# Patient Record
Sex: Female | Born: 1970 | Race: Black or African American | Hispanic: No | Smoking: Never smoker
Health system: Southern US, Community
[De-identification: ages and names within clinical notes are randomized; demographics above are authoritative.]

## PROBLEM LIST (undated history)

## (undated) DIAGNOSIS — I10 Essential (primary) hypertension: Secondary | ICD-10-CM

## (undated) HISTORY — PX: TUBAL LIGATION: SHX77

---

## 1999-11-26 ENCOUNTER — Emergency Department (HOSPITAL_COMMUNITY): Admission: EM | Admit: 1999-11-26 | Discharge: 1999-11-26 | Payer: Self-pay | Admitting: Emergency Medicine

## 2002-04-02 ENCOUNTER — Encounter: Payer: Self-pay | Admitting: Emergency Medicine

## 2002-04-02 ENCOUNTER — Emergency Department (HOSPITAL_COMMUNITY): Admission: EM | Admit: 2002-04-02 | Discharge: 2002-04-02 | Payer: Self-pay | Admitting: Emergency Medicine

## 2004-05-02 ENCOUNTER — Emergency Department (HOSPITAL_COMMUNITY): Admission: EM | Admit: 2004-05-02 | Discharge: 2004-05-02 | Payer: Self-pay | Admitting: Emergency Medicine

## 2005-08-23 ENCOUNTER — Inpatient Hospital Stay (HOSPITAL_COMMUNITY): Admission: AD | Admit: 2005-08-23 | Discharge: 2005-08-23 | Payer: Self-pay | Admitting: Obstetrics and Gynecology

## 2006-11-20 ENCOUNTER — Emergency Department (HOSPITAL_COMMUNITY): Admission: EM | Admit: 2006-11-20 | Discharge: 2006-11-20 | Payer: Self-pay | Admitting: Emergency Medicine

## 2010-10-09 ENCOUNTER — Encounter: Payer: Self-pay | Admitting: Emergency Medicine

## 2010-10-09 ENCOUNTER — Emergency Department (HOSPITAL_BASED_OUTPATIENT_CLINIC_OR_DEPARTMENT_OTHER)
Admission: EM | Admit: 2010-10-09 | Discharge: 2010-10-09 | Disposition: A | Discharge status: Home / Self Care | Payer: Self-pay | Attending: Emergency Medicine | Admitting: Emergency Medicine

## 2010-10-09 DIAGNOSIS — J309 Allergic rhinitis, unspecified: Secondary | ICD-10-CM | POA: Insufficient documentation

## 2010-10-09 DIAGNOSIS — I10 Essential (primary) hypertension: Secondary | ICD-10-CM | POA: Insufficient documentation

## 2010-10-09 HISTORY — DX: Essential (primary) hypertension: I10

## 2010-10-09 NOTE — ED Notes (Signed)
Pt c/o sinus congestion with difficulty breathing. Not relieve with OTC meds.

## 2010-10-09 NOTE — ED Notes (Signed)
Pt presents to ED today with c/o sinus congestion for the past week.  Pt has non-productive cough with no fever noted.  Pt has tried OTC meds with no relief in sx.

## 2010-10-10 NOTE — ED Provider Notes (Signed)
History     CSN: [id number] Arrival date & time: 10/09/2010  8:10 PM  Chief Complaint  Patient presents with  . Nasal Congestion   The history is provided by the patient.  She has had nasal congestion for the past week. Symptoms have been severe. She has taken Benadryl with slight relief. She has not had any fever. Rhinorrhea has been clear. No sinus pain. No cough. No dyspnea. She says she tried a nasal spray with no relief.  Past Medical History  Diagnosis Date  . Hypertension     Past Surgical History  Procedure Date  . Tubal ligation     No family history on file.  History  Substance Use Topics  . Smoking status: Never Smoker   . Smokeless tobacco: Not on file  . Alcohol Use: No    OB History    Grav Para Term Preterm Abortions TAB SAB Ect Mult Living                  Review of Systems  All other systems reviewed and are negative.    Physical Exam  BP 129/94  Pulse 96  Temp(Src) 97.9 F (36.6 C) (Oral)  Resp 20  SpO2 100%  Physical Exam  Nursing note and vitals reviewed. Constitutional: She is oriented to person, place, and time. She appears well-developed and well-nourished. No distress.  HENT:  Head: Normocephalic and atraumatic.  Right Ear: External ear normal.  Left Ear: External ear normal.  Mouth/Throat: Oropharynx is clear and moist.       No sinus tenderness.  Eyes: Conjunctivae and EOM are normal. Pupils are equal, round, and reactive to light. No scleral icterus.  Neck: Normal range of motion. Neck supple. No JVD present.  Cardiovascular: Normal rate, regular rhythm and normal heart sounds.   No murmur heard. Pulmonary/Chest: Effort normal and breath sounds normal. She has no wheezes. She has no rales. She exhibits no tenderness.  Abdominal: Soft. Bowel sounds are normal. She exhibits no mass. There is no tenderness.  Musculoskeletal: Normal range of motion. She exhibits no edema and no tenderness.  Lymphadenopathy:    She has no cervical  adenopathy.  Neurological: She is alert and oriented to person, place, and time. No cranial nerve deficit. Coordination normal.  Skin: Skin is warm and dry. No rash noted.  Psychiatric: She has a normal mood and affect. Her behavior is normal. Judgment and thought content normal.    ED Course  Procedures  MDM Rhinitis - probably allergic, possibly viral. Unlikely to benefit from steroid nasal spray since it would take several weeks to be effective.      Dione Booze, MD 10/10/10 [phone]

## 2012-11-17 ENCOUNTER — Other Ambulatory Visit: Payer: Self-pay

## 2012-11-17 DIAGNOSIS — Z1231 Encounter for screening mammogram for malignant neoplasm of breast: Secondary | ICD-10-CM

## 2012-11-24 ENCOUNTER — Ambulatory Visit
Admission: RE | Admit: 2012-11-24 | Discharge: 2012-11-24 | Disposition: A | Discharge status: Home / Self Care | Payer: BC Managed Care – PPO | Source: Ambulatory Visit

## 2012-11-24 DIAGNOSIS — Z1231 Encounter for screening mammogram for malignant neoplasm of breast: Secondary | ICD-10-CM

## 2012-12-08 ENCOUNTER — Other Ambulatory Visit: Payer: Self-pay | Admitting: Family Medicine

## 2012-12-08 DIAGNOSIS — N644 Mastodynia: Secondary | ICD-10-CM

## 2014-06-25 ENCOUNTER — Emergency Department (HOSPITAL_BASED_OUTPATIENT_CLINIC_OR_DEPARTMENT_OTHER): Payer: 59

## 2014-06-25 ENCOUNTER — Emergency Department (HOSPITAL_BASED_OUTPATIENT_CLINIC_OR_DEPARTMENT_OTHER)
Admission: EM | Admit: 2014-06-25 | Discharge: 2014-06-25 | Disposition: A | Discharge status: Home / Self Care | Payer: 59 | Attending: Emergency Medicine | Admitting: Emergency Medicine

## 2014-06-25 ENCOUNTER — Encounter (HOSPITAL_BASED_OUTPATIENT_CLINIC_OR_DEPARTMENT_OTHER): Payer: Self-pay | Admitting: *Deleted

## 2014-06-25 DIAGNOSIS — Y998 Other external cause status: Secondary | ICD-10-CM | POA: Diagnosis not present

## 2014-06-25 DIAGNOSIS — Z79899 Other long term (current) drug therapy: Secondary | ICD-10-CM | POA: Diagnosis not present

## 2014-06-25 DIAGNOSIS — I10 Essential (primary) hypertension: Secondary | ICD-10-CM | POA: Insufficient documentation

## 2014-06-25 DIAGNOSIS — M62838 Other muscle spasm: Secondary | ICD-10-CM

## 2014-06-25 DIAGNOSIS — S161XXA Strain of muscle, fascia and tendon at neck level, initial encounter: Secondary | ICD-10-CM | POA: Insufficient documentation

## 2014-06-25 DIAGNOSIS — S3992XA Unspecified injury of lower back, initial encounter: Secondary | ICD-10-CM | POA: Diagnosis not present

## 2014-06-25 DIAGNOSIS — Y92410 Unspecified street and highway as the place of occurrence of the external cause: Secondary | ICD-10-CM | POA: Diagnosis not present

## 2014-06-25 DIAGNOSIS — Y9389 Activity, other specified: Secondary | ICD-10-CM | POA: Insufficient documentation

## 2014-06-25 DIAGNOSIS — S199XXA Unspecified injury of neck, initial encounter: Secondary | ICD-10-CM | POA: Diagnosis present

## 2014-06-25 DIAGNOSIS — S4991XA Unspecified injury of right shoulder and upper arm, initial encounter: Secondary | ICD-10-CM | POA: Diagnosis not present

## 2014-06-25 MED ORDER — NAPROXEN 500 MG PO TABS: 500.0000 mg | ORAL_TABLET | Freq: Two times a day (BID) | ORAL | Status: AC

## 2014-06-25 MED ORDER — CYCLOBENZAPRINE HCL 10 MG PO TABS: 10.0000 mg | ORAL_TABLET | Freq: Three times a day (TID) | ORAL | Status: AC | PRN
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
## 2014-06-25 NOTE — ED Notes (Signed)
Patient transported to X-ray 

## 2014-06-25 NOTE — Discharge Instructions (Signed)
Take naproxen as prescribed. Rest, apply ice intermittently for the next 24 hours followed by heat. Avoid heavy lifting or hard physical activity. No driving or operating heavy machinery while taking flexeril. This medication may make you drowsy.  Cervical Sprain A cervical sprain is an injury in the neck in which the strong, fibrous tissues (ligaments) that connect your neck bones stretch or tear. Cervical sprains can range from mild to severe. Severe cervical sprains can cause the neck vertebrae to be unstable. This can lead to damage of the spinal cord and can result in serious nervous system problems. The amount of time it takes for a cervical sprain to get better depends on the cause and extent of the injury. Most cervical sprains heal in 1 to 3 weeks. CAUSES  Severe cervical sprains may be caused by:   Contact sport injuries (such as from football, rugby, wrestling, hockey, auto racing, gymnastics, diving, martial arts, or boxing).   Motor vehicle collisions.   Whiplash injuries. This is an injury from a sudden forward and backward whipping movement of the head and neck.  Falls.  Mild cervical sprains may be caused by:   Being in an awkward position, such as while cradling a telephone between your ear and shoulder.   Sitting in a chair that does not offer proper support.   Working at a poorly Marketing executivedesigned computer station.   Looking up or down for long periods of time.  SYMPTOMS   Pain, soreness, stiffness, or a burning sensation in the front, back, or sides of the neck. This discomfort may develop immediately after the injury or slowly, 24 hours or more after the injury.   Pain or tenderness directly in the middle of the back of the neck.   Shoulder or upper back pain.   Limited ability to move the neck.   Headache.   Dizziness.   Weakness, numbness, or tingling in the hands or arms.   Muscle spasms.   Difficulty swallowing or chewing.   Tenderness and  swelling of the neck.  DIAGNOSIS  Most of the time your health care provider can diagnose a cervical sprain by taking your history and doing a physical exam. Your health care provider will ask about previous neck injuries and any known neck problems, such as arthritis in the neck. X-rays may be taken to find out if there are any other problems, such as with the bones of the neck. Other tests, such as a CT scan or MRI, may also be needed.  TREATMENT  Treatment depends on the severity of the cervical sprain. Mild sprains can be treated with rest, keeping the neck in place (immobilization), and pain medicines. Severe cervical sprains are immediately immobilized. Further treatment is done to help with pain, muscle spasms, and other symptoms and may include:  Medicines, such as pain relievers, numbing medicines, or muscle relaxants.   Physical therapy. This may involve stretching exercises, strengthening exercises, and posture training. Exercises and improved posture can help stabilize the neck, strengthen muscles, and help stop symptoms from returning.  HOME CARE INSTRUCTIONS   Put ice on the injured area.   Put ice in a plastic bag.   Place a towel between your skin and the bag.   Leave the ice on for 15-20 minutes, 3-4 times a day.   If your injury was severe, you may have been given a cervical collar to wear. A cervical collar is a two-piece collar designed to keep your neck from moving while it heals.  Do not remove the collar unless instructed by your health care provider.  If you have long hair, keep it outside of the collar.  Ask your health care provider before making any adjustments to your collar. Minor adjustments may be required over time to improve comfort and reduce pressure on your chin or on the back of your head.  Ifyou are allowed to remove the collar for cleaning or bathing, follow your health care provider's instructions on how to do so safely.  Keep your collar  clean by wiping it with mild soap and water and drying it completely. If the collar you have been given includes removable pads, remove them every 1-2 days and hand wash them with soap and water. Allow them to air dry. They should be completely dry before you wear them in the collar.  If you are allowed to remove the collar for cleaning and bathing, wash and dry the skin of your neck. Check your skin for irritation or sores. If you see any, tell your health care provider.  Do not drive while wearing the collar.   Only take over-the-counter or prescription medicines for pain, discomfort, or fever as directed by your health care provider.   Keep all follow-up appointments as directed by your health care provider.   Keep all physical therapy appointments as directed by your health care provider.   Make any needed adjustments to your workstation to promote good posture.   Avoid positions and activities that make your symptoms worse.   Warm up and stretch before being active to help prevent problems.  SEEK MEDICAL CARE IF:   Your pain is not controlled with medicine.   You are unable to decrease your pain medicine over time as planned.   Your activity level is not improving as expected.  SEEK IMMEDIATE MEDICAL CARE IF:   You develop any bleeding.  You develop stomach upset.  You have signs of an allergic reaction to your medicine.   Your symptoms get worse.   You develop new, unexplained symptoms.   You have numbness, tingling, weakness, or paralysis in any part of your body.  MAKE SURE YOU:   Understand these instructions.  Will watch your condition.  Will get help right away if you are not doing well or get worse. Document Released: 11/15/2006 Document Revised: 01/23/2013 Document Reviewed: 07/26/2012 [facility] Patient Information 2015 [place], [place]. This information is not intended to replace advice given to you by your health care provider. Make sure you  discuss any questions you have with your health care provider.  Motor Vehicle Collision It is common to have multiple bruises and sore muscles after a motor vehicle collision (MVC). These tend to feel worse for the first 24 hours. You may have the most stiffness and soreness over the first several hours. You may also feel worse when you wake up the first morning after your collision. After this point, you will usually begin to improve with each day. The speed of improvement often depends on the severity of the collision, the number of injuries, and the location and nature of these injuries. HOME CARE INSTRUCTIONS  Put ice on the injured area.  Put ice in a plastic bag.  Place a towel between your skin and the bag.  Leave the ice on for 15-20 minutes, 3-4 times a day, or as directed by your health care provider.  Drink enough fluids to keep your urine clear or pale yellow. Do not drink alcohol.  Take a warm  shower or bath once or twice a day. This will increase blood flow to sore muscles.  You may return to activities as directed by your caregiver. Be careful when lifting, as this may aggravate neck or back pain.  Only take over-the-counter or prescription medicines for pain, discomfort, or fever as directed by your caregiver. Do not use aspirin. This may increase bruising and bleeding. SEEK IMMEDIATE MEDICAL CARE IF:  You have numbness, tingling, or weakness in the arms or legs.  You develop severe headaches not relieved with medicine.  You have severe neck pain, especially tenderness in the middle of the back of your neck.  You have changes in bowel or bladder control.  There is increasing pain in any area of the body.  You have shortness of breath, light-headedness, dizziness, or fainting.  You have chest pain.  You feel sick to your stomach (nauseous), throw up (vomit), or sweat.  You have increasing abdominal discomfort.  There is blood in your urine, stool, or  vomit.  You have pain in your shoulder (shoulder strap areas).  You feel your symptoms are getting worse. MAKE SURE YOU:  Understand these instructions.  Will watch your condition.  Will get help right away if you are not doing well or get worse. Document Released: 01/18/2005 Document Revised: 06/04/2013 Document Reviewed: 06/17/2010 [facility] Patient Information 2015 [place], [place]. This information is not intended to replace advice given to you by your health care provider. Make sure you discuss any questions you have with your health care provider.  Muscle Strain A muscle strain is an injury that occurs when a muscle is stretched beyond its normal length. Usually a small number of muscle fibers are torn when this happens. Muscle strain is rated in degrees. First-degree strains have the least amount of muscle fiber tearing and pain. Second-degree and third-degree strains have increasingly more tearing and pain.  Usually, recovery from muscle strain takes 1-2 weeks. Complete healing takes 5-6 weeks.  CAUSES  Muscle strain happens when a sudden, violent force placed on a muscle stretches it too far. This may occur with lifting, sports, or a fall.  RISK FACTORS Muscle strain is especially common in athletes.  SIGNS AND SYMPTOMS At the site of the muscle strain, there may be:  Pain.  Bruising.  Swelling.  Difficulty using the muscle due to pain or lack of normal function. DIAGNOSIS  Your health care provider will perform a physical exam and ask about your medical history. TREATMENT  Often, the best treatment for a muscle strain is resting, icing, and applying cold compresses to the injured area.  HOME CARE INSTRUCTIONS   Use the PRICE method of treatment to promote muscle healing during the first 2-3 days after your injury. The PRICE method involves:  Protecting the muscle from being injured again.  Restricting your activity and resting the injured body part.  Icing your  injury. To do this, put ice in a plastic bag. Place a towel between your skin and the bag. Then, apply the ice and leave it on from 15-20 minutes each hour. After the third day, switch to moist heat packs.  Apply compression to the injured area with a splint or elastic bandage. Be careful not to wrap it too tightly. This may interfere with blood circulation or increase swelling.  Elevate the injured body part above the level of your heart as often as you can.  Only take over-the-counter or prescription medicines for pain, discomfort, or fever as directed by your  health care provider.  Warming up prior to exercise helps to prevent future muscle strains. SEEK MEDICAL CARE IF:   You have increasing pain or swelling in the injured area.  You have numbness, tingling, or a significant loss of strength in the injured area. MAKE SURE YOU:   Understand these instructions.  Will watch your condition.  Will get help right away if you are not doing well or get worse. Document Released: 01/18/2005 Document Revised: 11/08/2012 Document Reviewed: 08/17/2012 [facility] Patient Information 2015 [place], [place]. This information is not intended to replace advice given to you by your health care provider. Make sure you discuss any questions you have with your health care provider.
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
## 2014-07-19 ENCOUNTER — Encounter (HOSPITAL_BASED_OUTPATIENT_CLINIC_OR_DEPARTMENT_OTHER): Payer: Self-pay | Admitting: *Deleted

## 2014-07-19 ENCOUNTER — Emergency Department (HOSPITAL_BASED_OUTPATIENT_CLINIC_OR_DEPARTMENT_OTHER)
Admission: EM | Admit: 2014-07-19 | Discharge: 2014-07-19 | Disposition: A | Discharge status: Home / Self Care | Payer: 59 | Attending: Emergency Medicine | Admitting: Emergency Medicine

## 2014-07-19 ENCOUNTER — Emergency Department (HOSPITAL_BASED_OUTPATIENT_CLINIC_OR_DEPARTMENT_OTHER): Payer: 59

## 2014-07-19 DIAGNOSIS — M79662 Pain in left lower leg: Secondary | ICD-10-CM | POA: Diagnosis present

## 2014-07-19 DIAGNOSIS — M79606 Pain in leg, unspecified: Secondary | ICD-10-CM

## 2014-07-19 DIAGNOSIS — Z79899 Other long term (current) drug therapy: Secondary | ICD-10-CM | POA: Insufficient documentation

## 2014-07-19 DIAGNOSIS — M25471 Effusion, right ankle: Secondary | ICD-10-CM | POA: Diagnosis not present

## 2014-07-19 DIAGNOSIS — M25472 Effusion, left ankle: Secondary | ICD-10-CM | POA: Insufficient documentation

## 2014-07-19 DIAGNOSIS — Z791 Long term (current) use of non-steroidal anti-inflammatories (NSAID): Secondary | ICD-10-CM | POA: Insufficient documentation

## 2014-07-19 DIAGNOSIS — I10 Essential (primary) hypertension: Secondary | ICD-10-CM | POA: Diagnosis not present

## 2014-07-19 NOTE — ED Notes (Signed)
Patient transported to Ultrasound 

## 2014-07-19 NOTE — ED Provider Notes (Addendum)
CSN: [id number]     Arrival date & time 07/19/14  1009 History   First MD Initiated Contact with Patient 07/19/14 1020     Chief Complaint  Patient presents with  . Leg Pain     (Consider location/radiation/quality/duration/timing/severity/associated sxs/prior Treatment) Patient is a 44 y.o. female presenting with leg pain. The history is provided by the patient.  Leg Pain Location:  Leg Time since incident:  2 weeks Injury: no   Leg location:  L lower leg Pain details:    Quality:  Aching and throbbing   Radiates to:  Does not radiate   Severity:  Moderate   Onset quality:  Gradual   Timing:  Constant   Progression:  Improving Chronicity:  New Prior injury to area:  No Relieved by:  Rest and elevation Worsened by:  Bearing weight and activity Associated symptoms: swelling   Associated symptoms: no decreased ROM, no fever and no numbness   Risk factors comment:  Mom with hx of blood clots   Past Medical History  Diagnosis Date  . Hypertension    Past Surgical History  Procedure Laterality Date  . Tubal ligation     No family history on file. History  Substance Use Topics  . Smoking status: Never Smoker   . Smokeless tobacco: Not on file  . Alcohol Use: No   OB History    No data available     Review of Systems  Constitutional: Negative for fever.  All other systems reviewed and are negative.     Allergies  Fish allergy and Sulfa antibiotics  Home Medications   Prior to Admission medications   Medication Sig Start Date End Date Taking? Authorizing Provider  AMLODIPINE BESYLATE PO Take by mouth.    Historical Provider, MD  cyclobenzaprine (FLEXERIL) 10 MG tablet Take 1 tablet (10 mg total) by mouth every 8 (eight) hours as needed for muscle spasms. 06/25/14   Kathrynn Speed, PA-C  diphenhydrAMINE (BENADRYL) 25 MG tablet Take 25 mg by mouth every 6 (six) hours as needed. Allergies      Historical Provider, MD  Garlic 100 MG TABS Take 1 tablet by mouth  daily.      Historical Provider, MD  Homeopathic Products [facility] COLD REMEDY PO) Take 1 each by mouth every 3 (three) hours as needed. Cough     Historical Provider, MD  HYDROCHLOROTHIAZIDE PO Take by mouth.    Historical Provider, MD  lisinopril-hydrochlorothiazide (PRINZIDE,ZESTORETIC) 20-25 MG per tablet Take 1 tablet by mouth daily.      Historical Provider, MD  loratadine (CLARITIN) 10 MG tablet Take 10 mg by mouth daily as needed. allergies     Historical Provider, MD  naproxen (NAPROSYN) 500 MG tablet Take 1 tablet (500 mg total) by mouth 2 (two) times daily. 06/25/14   Kathrynn Speed, PA-C  oxymetazoline (AFRIN) 0.05 % nasal spray Place 2 sprays into the nose 2 (two) times daily.      Historical Provider, MD  phenylephrine (SUDAFED PE) 10 MG TABS Take 10 mg by mouth every 4 (four) hours as needed. allergies     Historical Provider, MD   BP 131/72 mmHg  Pulse 88  Temp(Src) 98.4 F (36.9 C) (Oral)  Resp 18  Ht 5\' 4"  (1.626 m)  Wt 260 lb (117.935 kg)  BMI 44.61 kg/m2  SpO2 100%  LMP 06/21/2014 Physical Exam  Constitutional: She is oriented to person, place, and time. She appears well-developed and well-nourished. No distress.  HENT:  Head: Normocephalic and atraumatic.  Mouth/Throat: Oropharynx is clear and moist.  Eyes: Conjunctivae and EOM are normal. Pupils are equal, round, and reactive to light.  Neck: Normal range of motion. Neck supple.  Cardiovascular: Normal rate, regular rhythm, normal heart sounds and intact distal pulses.   No murmur heard. Pulmonary/Chest: Effort normal and breath sounds normal. No respiratory distress. She has no wheezes. She has no rales.  Musculoskeletal: Normal range of motion. She exhibits no edema or tenderness.  Mild tenderness over the lateral portion of the left lower leg.  No notable swelling.  2+ DP pulses  Neurological: She is alert and oriented to person, place, and time.  Skin: Skin is warm and dry. No rash noted. No erythema.   Psychiatric: She has a normal mood and affect. Her behavior is normal.  Nursing note and vitals reviewed.   ED Course  Procedures (including critical care time) Labs Review Labs Reviewed - No data to display  Imaging Review US Venous Img Lower Unilateral Left  07/19/2014   CLINICAL DATA:  Left lateral calf pain for 2 weeks. No known injury.  EXAM: Left LOWER EXTREMITY VENOUS DOPPLER ULTRASOUND  TECHNIQUE: Gray-scale sonography with graded compression, as well as color Doppler and duplex ultrasound were performed to evaluate the lower extremity deep venous systems from the level of the common femoral vein and including the common femoral, femoral, profunda femoral, popliteal and calf veins including the posterior tibial, peroneal and gastrocnemius veins when visible. The superficial great saphenous vein was also interrogated. Spectral Doppler was utilized to evaluate flow at rest and with distal augmentation maneuvers in the common femoral, femoral and popliteal veins.  COMPARISON:  None.  FINDINGS: Contralateral Common Femoral Vein: Respiratory phasicity is normal and symmetric with the symptomatic side. No evidence of thrombus. Normal compressibility.  Common Femoral Vein: No evidence of thrombus. Normal compressibility, respiratory phasicity and response to augmentation.  Saphenofemoral Junction: No evidence of thrombus. Normal compressibility and flow on color Doppler imaging.  Profunda Femoral Vein: No evidence of thrombus. Normal compressibility and flow on color Doppler imaging.  Femoral Vein: No evidence of thrombus. Normal compressibility, respiratory phasicity and response to augmentation.  Popliteal Vein: No evidence of thrombus. Normal compressibility, respiratory phasicity and response to augmentation.  Calf Veins: No evidence of thrombus. Normal compressibility and flow on color Doppler imaging. Limited assessment of calf veins due to body habitus. Peroneal vein not well seen.  Superficial  Great Saphenous Vein: No evidence of thrombus. Normal compressibility and flow on color Doppler imaging.  Venous Reflux:  None.  Other Findings:  None.  IMPRESSION: No evidence of deep venous thrombosis.   Electronically Signed   By: Gaylyn Rong M.D.   On: 07/19/2014 12:00     EKG Interpretation None      MDM   Final diagnoses:  Leg pain    Patient with a two-week history of left lateral leg tenderness that is worse when attempting to ambulate and mild ankle swelling bilaterally. Patient works in a job where she is on her feet constantly and pain is statically worst with walking. She does not wear support hose and denies a history of prior clot. No recent travel, hormone therapy however her mother does have history  Denies CP or SOB.    Doppler of the left lower ext pending.  12:11 PM Neg for clot.  Pt d/ced home.  Gwyneth Sprout, MD 07/19/14 [id number]  Gwyneth Sprout, MD 07/19/14 1212

## 2014-07-19 NOTE — ED Notes (Signed)
Pt c/o left heel pain and ankle swelling "for a while" but now the pain is spreading up her left calf. Pt c/o some SOB and feeling jittery. Denies CP or any long car trips recently.

## 2014-07-19 NOTE — ED Notes (Signed)
Pt d/c home. Referral given and instructions for f/u discussed

## 2014-07-19 NOTE — ED Notes (Signed)
MD at bedside. 

## 2014-07-19 NOTE — ED Notes (Signed)
Pt given snack per okay from Dr Anitra Lauth

## 2016-11-17 IMAGING — US US EXTREM LOW VENOUS*L*
1 series · 13 of 24 positions shown · non-contrast
Comparison: None.

CLINICAL DATA: Left lateral calf pain for 2 weeks. No known injury.



[Series 1: us extrem low venous*left* · 0.08mm/px · 13 of 40 slices shown]
[im 1/40]
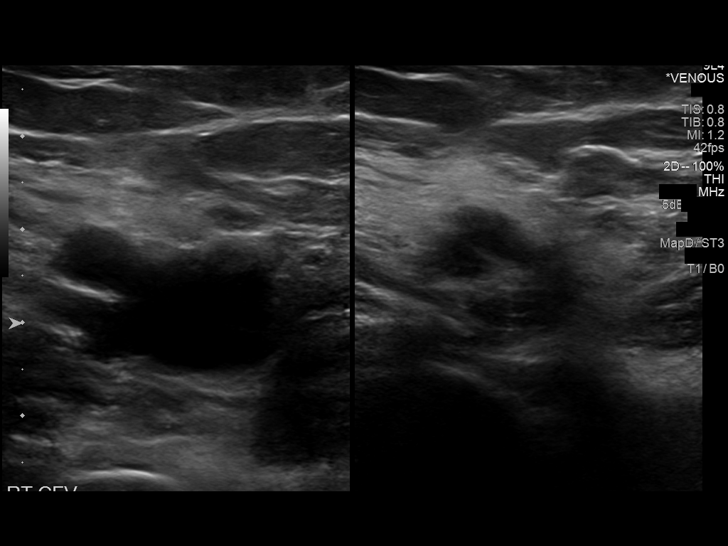
[im 4/40]
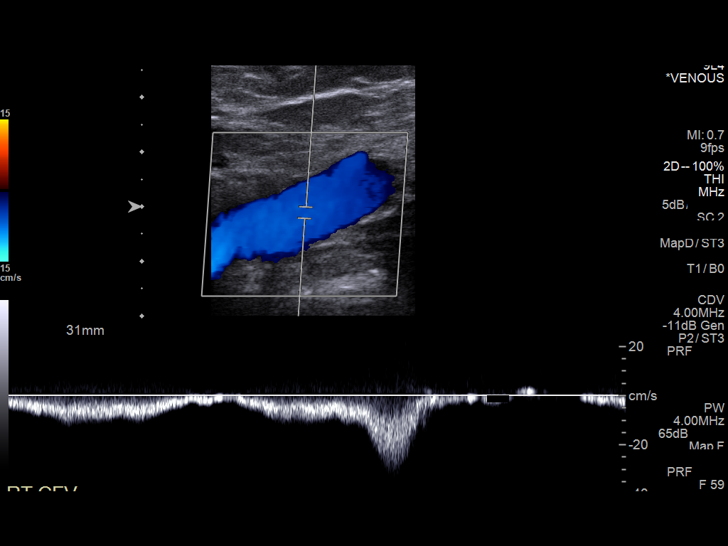
[im 7/40]
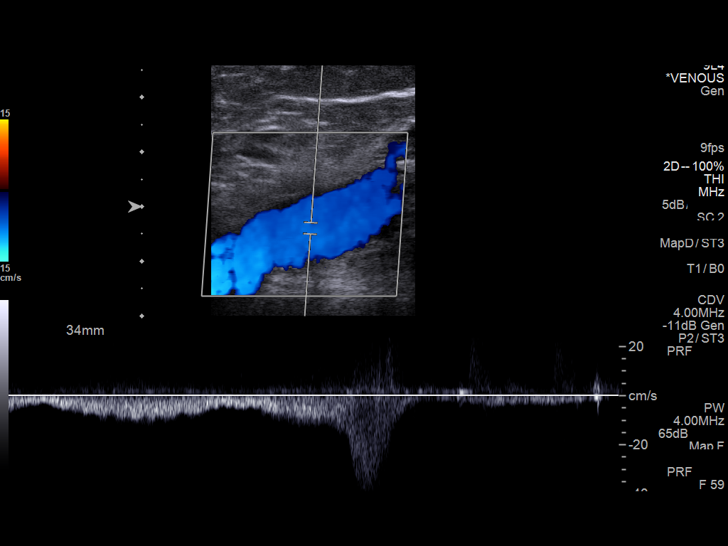
[im 11/40]
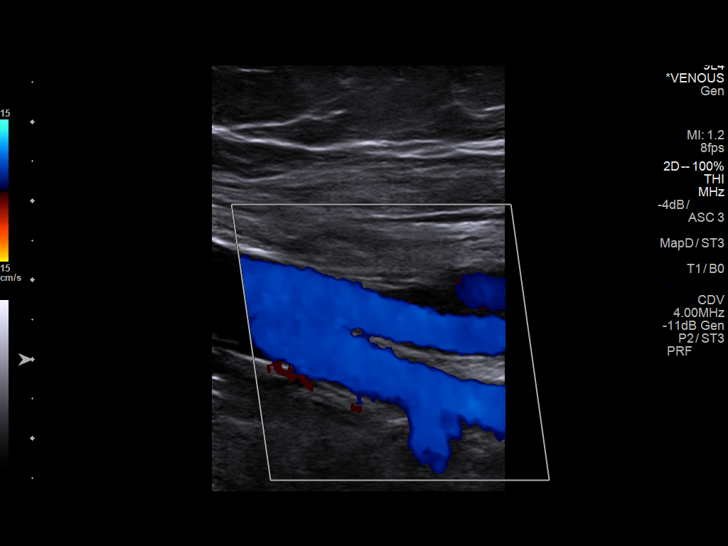
[im 14/40]
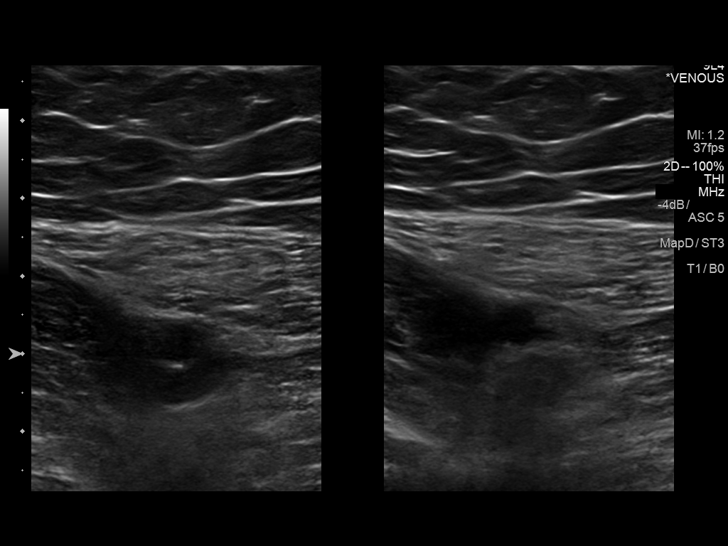
[im 17/40]
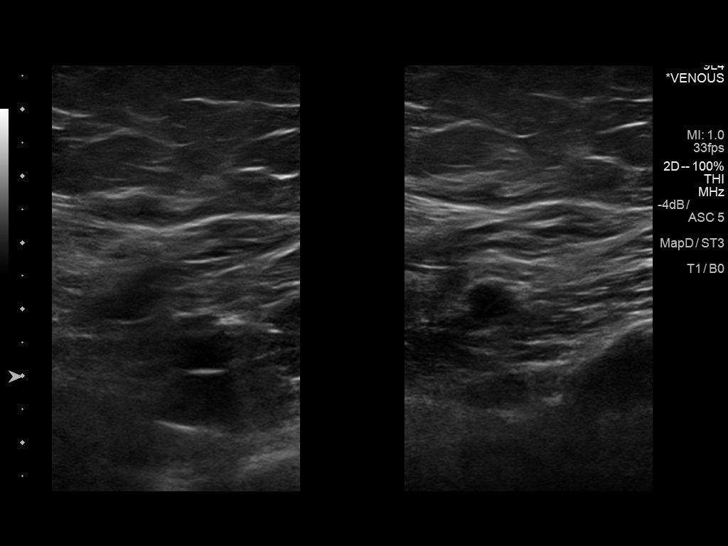
[im 21/40]
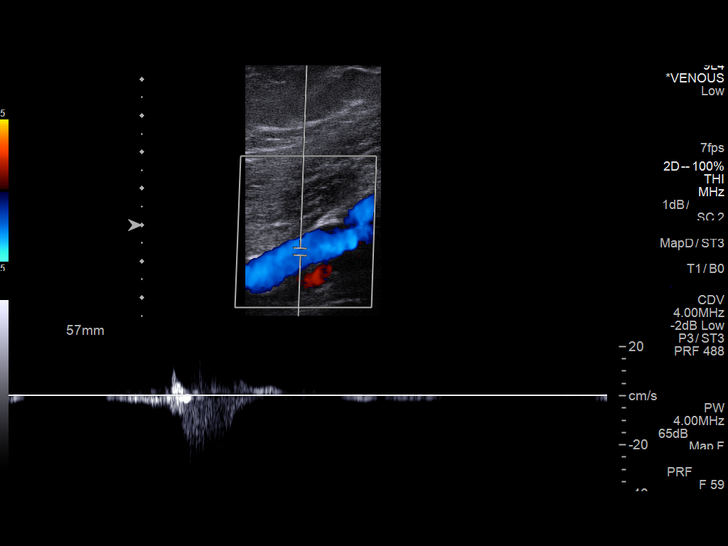
[im 23/40]
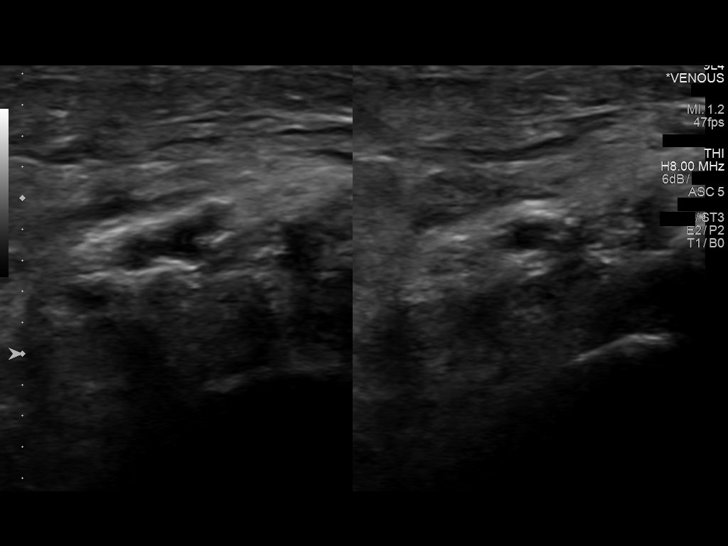
[im 26/40]
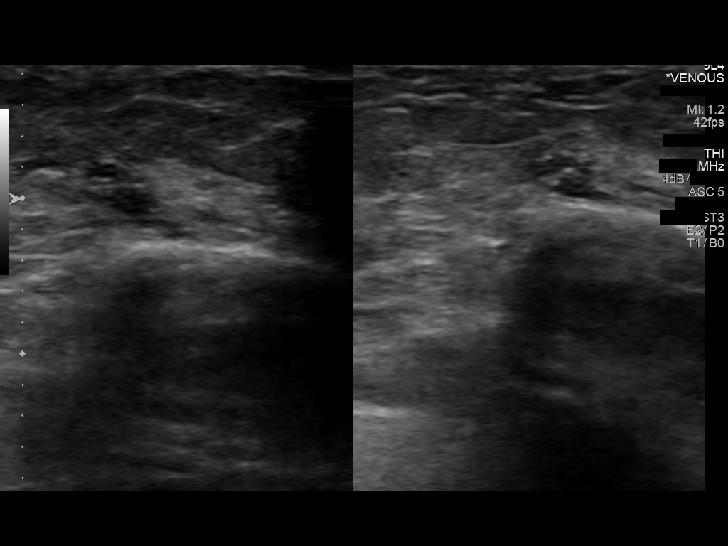
[im 29/40]
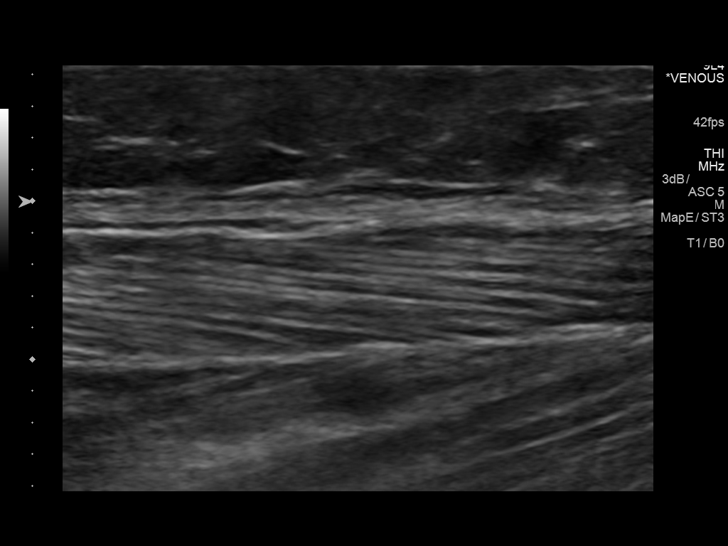
[im 33/40]
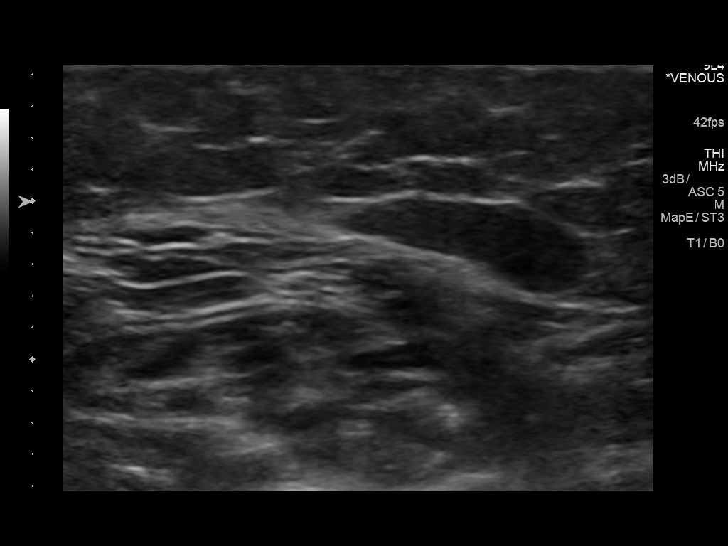
[im 36/40]
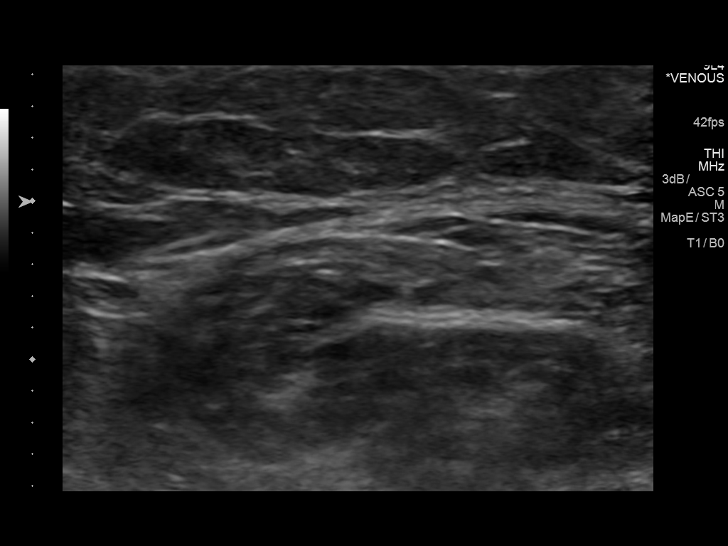
[im 40/40]
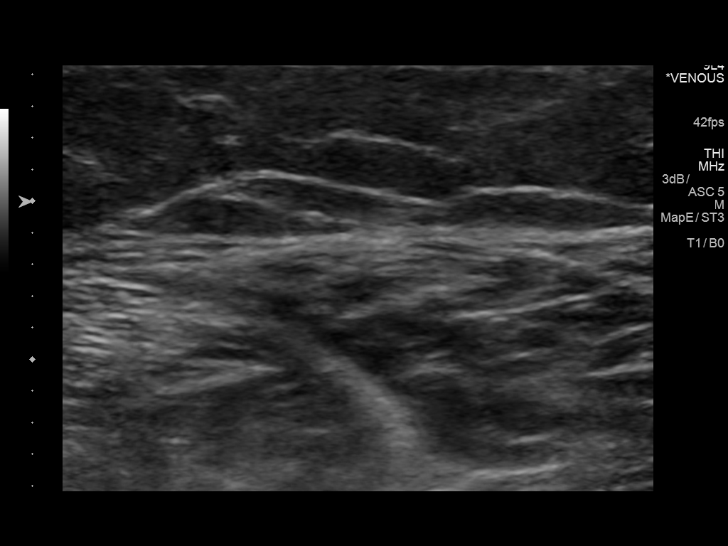

[13 of 24 positions shown; findings below may reference images not displayed]

FINDINGS: Contralateral Common Femoral Vein: Respiratory phasicity is normal
and symmetric with the symptomatic side. No evidence of thrombus.
Normal compressibility.

Common Femoral Vein: No evidence of thrombus. Normal
compressibility, respiratory phasicity and response to augmentation.

Saphenofemoral Junction: No evidence of thrombus. Normal
compressibility and flow on color Doppler imaging.

Profunda Femoral Vein: No evidence of thrombus. Normal
compressibility and flow on color Doppler imaging.

Femoral Vein: No evidence of thrombus. Normal compressibility,
respiratory phasicity and response to augmentation.

Popliteal Vein: No evidence of thrombus. Normal compressibility,
respiratory phasicity and response to augmentation.

Calf Veins: No evidence of thrombus. Normal compressibility and flow
on color Doppler imaging. Limited assessment of calf veins due to
body habitus. Peroneal vein not well seen.

Superficial Great Saphenous Vein: No evidence of thrombus. Normal
compressibility and flow on color Doppler imaging.

Venous Reflux:  None.

Other Findings:  None.
IMPRESSION: No evidence of deep venous thrombosis.

## 2018-12-26 ENCOUNTER — Other Ambulatory Visit: Payer: Self-pay

## 2018-12-26 DIAGNOSIS — Z20822 Contact with and (suspected) exposure to covid-19: Secondary | ICD-10-CM

## 2018-12-28 LAB — NOVEL CORONAVIRUS, NAA: SARS-CoV-2, NAA: NOT DETECTED
